# Patient Record
Sex: Male | Born: 1965 | Race: White | Hispanic: No | Marital: Married | Smoking: Current every day smoker
Health system: Southern US, Community
[De-identification: ages and names within clinical notes are randomized; demographics above are authoritative.]

---

## 2005-02-26 ENCOUNTER — Encounter
Admission: RE | Admit: 2005-02-26 | Discharge: 2005-02-26 | Payer: Self-pay | Admitting: Physical Medicine and Rehabilitation

## 2005-04-21 ENCOUNTER — Emergency Department (HOSPITAL_COMMUNITY): Admission: EM | Admit: 2005-04-21 | Discharge: 2005-04-21 | Payer: Self-pay | Admitting: Emergency Medicine

## 2008-02-22 ENCOUNTER — Encounter: Admission: RE | Admit: 2008-02-22 | Discharge: 2008-02-22 | Payer: Self-pay | Admitting: Orthopedic Surgery

## 2008-08-16 ENCOUNTER — Ambulatory Visit (HOSPITAL_COMMUNITY): Admission: RE | Admit: 2008-08-16 | Discharge: 2008-08-16 | Payer: Self-pay | Admitting: Orthopedic Surgery

## 2009-02-20 ENCOUNTER — Emergency Department (HOSPITAL_COMMUNITY): Admission: EM | Admit: 2009-02-20 | Discharge: 2009-02-20 | Payer: Self-pay | Admitting: Emergency Medicine

## 2009-03-19 ENCOUNTER — Inpatient Hospital Stay (HOSPITAL_COMMUNITY): Admission: EM | Admit: 2009-03-19 | Discharge: 2009-03-23 | Payer: Self-pay | Admitting: Emergency Medicine

## 2009-05-23 ENCOUNTER — Encounter: Admission: RE | Admit: 2009-05-23 | Discharge: 2009-08-21 | Payer: Self-pay | Admitting: Orthopedic Surgery

## 2009-08-01 ENCOUNTER — Emergency Department (HOSPITAL_BASED_OUTPATIENT_CLINIC_OR_DEPARTMENT_OTHER): Admission: EM | Admit: 2009-08-01 | Discharge: 2009-08-01 | Payer: Self-pay | Admitting: Emergency Medicine

## 2009-08-02 ENCOUNTER — Emergency Department (HOSPITAL_COMMUNITY): Admission: EM | Admit: 2009-08-02 | Discharge: 2009-08-02 | Payer: Self-pay | Admitting: Emergency Medicine

## 2009-08-27 ENCOUNTER — Emergency Department (HOSPITAL_COMMUNITY): Admission: EM | Admit: 2009-08-27 | Discharge: 2009-08-27 | Payer: Self-pay | Admitting: Emergency Medicine

## 2009-09-08 ENCOUNTER — Encounter: Admission: RE | Admit: 2009-09-08 | Discharge: 2009-09-08 | Payer: Self-pay | Admitting: Orthopedic Surgery

## 2009-09-27 ENCOUNTER — Encounter: Admission: RE | Admit: 2009-09-27 | Discharge: 2009-09-27 | Payer: Self-pay | Admitting: Orthopedic Surgery

## 2009-10-15 ENCOUNTER — Emergency Department (HOSPITAL_COMMUNITY): Admission: EM | Admit: 2009-10-15 | Discharge: 2009-10-15 | Payer: Self-pay | Admitting: Emergency Medicine

## 2009-10-20 ENCOUNTER — Ambulatory Visit (HOSPITAL_COMMUNITY): Admission: RE | Admit: 2009-10-20 | Discharge: 2009-10-20 | Payer: Self-pay | Admitting: Orthopedic Surgery

## 2009-12-20 ENCOUNTER — Inpatient Hospital Stay (HOSPITAL_COMMUNITY): Admission: RE | Admit: 2009-12-20 | Discharge: 2009-12-21 | Payer: Self-pay | Admitting: Orthopedic Surgery

## 2010-04-02 ENCOUNTER — Emergency Department (HOSPITAL_COMMUNITY): Admission: EM | Admit: 2010-04-02 | Discharge: 2010-04-02 | Payer: Self-pay | Admitting: Emergency Medicine

## 2010-08-03 LAB — HEPATIC FUNCTION PANEL
Albumin: 3.5 g/dL (ref 3.5–5.2)
Alkaline Phosphatase: 75 U/L (ref 39–117)
Total Protein: 6.5 g/dL (ref 6.0–8.3)

## 2010-08-03 LAB — BASIC METABOLIC PANEL
Calcium: 9.1 mg/dL (ref 8.4–10.5)
Creatinine, Ser: 0.81 mg/dL (ref 0.4–1.5)
GFR calc Af Amer: >60 mL/min (ref >60)
GFR calc non Af Amer: >60 mL/min (ref >60)
Glucose, Bld: 106 mg/dL — ABNORMAL HIGH (ref 70–99)
Sodium: 141 mEq/L (ref 135–145)

## 2010-08-03 LAB — CBC
Hemoglobin: 14.6 g/dL (ref 13.0–17.0)
MCH: 30.5 pg (ref 26.0–34.0)
MCHC: 33.3 g/dL (ref 30.0–36.0)
Platelets: 160 10*3/uL (ref 150–400)

## 2010-08-03 LAB — SURGICAL PCR SCREEN
MRSA, PCR: NEGATIVE
Staphylococcus aureus: NEGATIVE

## 2010-08-06 LAB — URINALYSIS, ROUTINE W REFLEX MICROSCOPIC
Bilirubin Urine: NEGATIVE
Glucose, UA: NEGATIVE mg/dL
Nitrite: NEGATIVE
Specific Gravity, Urine: 1.01 (ref 1.005–1.030)
pH: 6 (ref 5.0–8.0)

## 2010-08-06 LAB — COMPREHENSIVE METABOLIC PANEL
ALT: 23 U/L (ref 0–53)
Albumin: 3.5 g/dL (ref 3.5–5.2)
Alkaline Phosphatase: 75 U/L (ref 39–117)
Calcium: 9.1 mg/dL (ref 8.4–10.5)
GFR calc Af Amer: >60 mL/min (ref >60)
Glucose, Bld: 99 mg/dL (ref 70–99)
Potassium: 4.3 mEq/L (ref 3.5–5.1)
Sodium: 134 mEq/L — ABNORMAL LOW (ref 135–145)
Total Protein: 6.4 g/dL (ref 6.0–8.3)

## 2010-08-06 LAB — CBC
Hemoglobin: 13.8 g/dL (ref 13.0–17.0)
MCHC: 33.8 g/dL (ref 30.0–36.0)
RBC: 4.6 MIL/uL (ref 4.22–5.81)
WBC: 6.7 10*3/uL (ref 4.0–10.5)

## 2010-08-06 LAB — DIFFERENTIAL
Basophils Relative: 0 % (ref 0–1)
Eosinophils Absolute: 0 10*3/uL (ref 0.0–0.7)
Lymphs Abs: 1.6 10*3/uL (ref 0.7–4.0)
Monocytes Absolute: 0.5 10*3/uL (ref 0.1–1.0)
Monocytes Relative: 8 % (ref 3–12)
Neutrophils Relative %: 68 % (ref 43–77)

## 2010-08-06 LAB — CK TOTAL AND CKMB (NOT AT ARMC)
CK, MB: 1.2 ng/mL (ref 0.3–4.0)
Relative Index: INVALID (ref 0.0–2.5)
Total CK: 46 U/L (ref 7–232)

## 2010-08-06 LAB — TROPONIN I: Troponin I: 0.02 ng/mL (ref 0.00–0.06)

## 2010-08-13 LAB — URINALYSIS, ROUTINE W REFLEX MICROSCOPIC
Nitrite: NEGATIVE
Specific Gravity, Urine: 1.011 (ref 1.005–1.030)
Urobilinogen, UA: 0.2 mg/dL (ref 0.0–1.0)
pH: 7 (ref 5.0–8.0)

## 2010-08-22 LAB — BASIC METABOLIC PANEL
Calcium: 9.2 mg/dL (ref 8.4–10.5)
GFR calc Af Amer: >60 mL/min (ref >60)
GFR calc non Af Amer: >60 mL/min (ref >60)
Potassium: 3.8 mEq/L (ref 3.5–5.1)
Sodium: 139 mEq/L (ref 135–145)

## 2010-08-22 LAB — CBC
Hemoglobin: 14.4 g/dL (ref 13.0–17.0)
RBC: 4.63 MIL/uL (ref 4.22–5.81)
WBC: 5.3 10*3/uL (ref 4.0–10.5)

## 2010-08-23 LAB — DIFFERENTIAL
Basophils Relative: 1 % (ref 0–1)
Eosinophils Absolute: 0 10*3/uL (ref 0.0–0.7)
Eosinophils Relative: 1 % (ref 0–5)
Monocytes Absolute: 0.4 10*3/uL (ref 0.1–1.0)
Monocytes Relative: 7 % (ref 3–12)
Neutro Abs: 3.8 10*3/uL (ref 1.7–7.7)

## 2010-08-23 LAB — URINE CULTURE
Colony Count: NO GROWTH
Culture: NO GROWTH

## 2010-08-23 LAB — BASIC METABOLIC PANEL
Calcium: 8.6 mg/dL (ref 8.4–10.5)
GFR calc Af Amer: >60 mL/min (ref >60)
GFR calc non Af Amer: >60 mL/min (ref >60)
Sodium: 138 mEq/L (ref 135–145)

## 2010-08-23 LAB — POCT I-STAT, CHEM 8
Calcium, Ion: 1.15 mmol/L (ref 1.12–1.32)
Chloride: 103 mEq/L (ref 96–112)
Glucose, Bld: 117 mg/dL — ABNORMAL HIGH (ref 70–99)
HCT: 43 % (ref 39.0–52.0)

## 2010-08-23 LAB — CBC
HCT: 40.6 % (ref 39.0–52.0)
Hemoglobin: 14 g/dL (ref 13.0–17.0)
MCHC: 34.5 g/dL (ref 30.0–36.0)
MCV: 90.2 fL (ref 78.0–100.0)
RBC: 4.5 MIL/uL (ref 4.22–5.81)

## 2010-08-23 LAB — PROTIME-INR
INR: 1.04 (ref 0.00–1.49)
Prothrombin Time: 13.5 seconds (ref 11.6–15.2)

## 2010-08-23 LAB — APTT: aPTT: 34 seconds (ref 24–37)

## 2010-08-23 LAB — URINALYSIS, ROUTINE W REFLEX MICROSCOPIC
Bilirubin Urine: NEGATIVE
Glucose, UA: NEGATIVE mg/dL
Ketones, ur: NEGATIVE mg/dL
Nitrite: NEGATIVE
pH: 7 (ref 5.0–8.0)

## 2010-11-26 ENCOUNTER — Other Ambulatory Visit: Payer: Self-pay | Admitting: Orthopedic Surgery

## 2010-11-26 DIAGNOSIS — M542 Cervicalgia: Secondary | ICD-10-CM

## 2010-11-28 ENCOUNTER — Ambulatory Visit
Admission: RE | Admit: 2010-11-28 | Discharge: 2010-11-28 | Disposition: A | Discharge status: Home / Self Care | Payer: Medicare Other | Source: Ambulatory Visit | Attending: Orthopedic Surgery | Admitting: Orthopedic Surgery

## 2010-11-28 DIAGNOSIS — M542 Cervicalgia: Secondary | ICD-10-CM

## 2010-12-25 ENCOUNTER — Other Ambulatory Visit (HOSPITAL_COMMUNITY): Payer: Self-pay | Admitting: Orthopedic Surgery

## 2010-12-25 ENCOUNTER — Encounter (HOSPITAL_COMMUNITY)
Admission: RE | Admit: 2010-12-25 | Discharge: 2010-12-25 | Disposition: A | Discharge status: Home / Self Care | Payer: Medicare Other | Source: Ambulatory Visit | Attending: Orthopedic Surgery | Admitting: Orthopedic Surgery

## 2010-12-25 ENCOUNTER — Ambulatory Visit (HOSPITAL_COMMUNITY)
Admission: RE | Admit: 2010-12-25 | Discharge: 2010-12-25 | Disposition: A | Discharge status: Home / Self Care | Payer: Medicare Other | Source: Ambulatory Visit | Attending: Orthopedic Surgery | Admitting: Orthopedic Surgery

## 2010-12-25 DIAGNOSIS — Z01812 Encounter for preprocedural laboratory examination: Secondary | ICD-10-CM | POA: Insufficient documentation

## 2010-12-25 DIAGNOSIS — S129XXA Fracture of neck, unspecified, initial encounter: Secondary | ICD-10-CM

## 2010-12-25 DIAGNOSIS — Z01818 Encounter for other preprocedural examination: Secondary | ICD-10-CM | POA: Insufficient documentation

## 2010-12-25 LAB — COMPREHENSIVE METABOLIC PANEL
ALT: 11 U/L (ref 0–53)
CO2: 28 mEq/L (ref 19–32)
Calcium: 9.5 mg/dL (ref 8.4–10.5)
Chloride: 105 mEq/L (ref 96–112)
Creatinine, Ser: 0.77 mg/dL (ref 0.50–1.35)
GFR calc Af Amer: >60 mL/min (ref >60)
GFR calc non Af Amer: >60 mL/min (ref >60)
Glucose, Bld: 92 mg/dL (ref 70–99)
Sodium: 140 mEq/L (ref 135–145)
Total Bilirubin: 0.2 mg/dL — ABNORMAL LOW (ref 0.3–1.2)

## 2010-12-25 LAB — CBC
MCHC: 34.3 g/dL (ref 30.0–36.0)
RDW: 13.9 % (ref 11.5–15.5)

## 2011-01-02 ENCOUNTER — Ambulatory Visit (HOSPITAL_COMMUNITY): Payer: Medicare Other

## 2011-01-02 ENCOUNTER — Inpatient Hospital Stay (HOSPITAL_COMMUNITY)
Admission: RE | Admit: 2011-01-02 | Discharge: 2011-01-03 | DRG: 030 | Disposition: A | Discharge status: Home / Self Care | Payer: Medicare Other | Source: Ambulatory Visit | Attending: Orthopedic Surgery | Admitting: Orthopedic Surgery

## 2011-01-02 DIAGNOSIS — Z981 Arthrodesis status: Secondary | ICD-10-CM

## 2011-01-02 DIAGNOSIS — T85695A Other mechanical complication of other nervous system device, implant or graft, initial encounter: Principal | ICD-10-CM | POA: Diagnosis present

## 2011-01-02 DIAGNOSIS — F17200 Nicotine dependence, unspecified, uncomplicated: Secondary | ICD-10-CM | POA: Diagnosis present

## 2011-01-02 DIAGNOSIS — Y831 Surgical operation with implant of artificial internal device as the cause of abnormal reaction of the patient, or of later complication, without mention of misadventure at the time of the procedure: Secondary | ICD-10-CM | POA: Diagnosis present

## 2011-01-02 DIAGNOSIS — Z472 Encounter for removal of internal fixation device: Secondary | ICD-10-CM

## 2011-01-10 NOTE — Op Note (Signed)
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  OPERATIVE PROCEDURE: 1. Removal of anterior cervical hardware C5-6, C6-7. 2. Exploration of fusion C5-6, C6-7. 3. Posterior fusion with posterior arthrodesis C5-6, C6-7. 4. Posterior instrumented fusion C5-C7.  COMPLICATIONS:  None.  BLOOD LOSS.:  Minimal.  FIRST ASSISTANT:  Norval Gable, PA  HISTORY:  Robert Brandt is a very pleasant 45 year old gentleman who about 6-8 months ago had an anterior cervical diskectomy and fusion.  The patient failed to follow up on a regular basis as instructed and then ultimately when got insurance back return to see me approximately 6-7 months status post surgery.  At that point, the intervertebral graft appeared to be solidly fused, but the anterior cervical plate had migrated off the spine and the patient was having difficulty swallowing.  As a result, CT scan was ordered which showed the migration of the plate and appeared to have solid bone bridge at the both intervertebral graft, as a result the ongoing neck pain and his dysphagia.  We elected to remove the anterior cervical plate to evaluate the fusion and then if needed supplemental posterior to decrease the overall range of motion and hopefully decrease his pain.  OPERATIVE NOTE:  The patient was brought to the operating room, placed supine on the operating table.  After successful induction of general anesthesia, endotracheal intubation, TED, SCDs, and Foley were inserted. The  patient was placed supine on the [place] flat table.  All bony prominences were well-padded and the neck of cervical spine was prepared.  Appropriate time-out was then done to confirm the patient procedure and all other pertinent important data.  Once this was done, a standard right-sided Clementeen Graham approach to cervical spine was taken.  A longitudinal incision was made along the lateral and medial border of sternocleidomastoid.  Sharp dissection was carried out down to and through the platysma, I sharply dissected the deep cervical fascia until I got the prevertebral fascia.  I then dissected sharply through this until I could palpate and see the anterior cervical plate.  I removed the overlying scar tissue, identified the esophagus and swept it and the trachea medially and protected it with a retractor.  I identified the carotid sheath protected laterally with a finger.  At this point, I exposed the anterior cervical plate from Z6-X0.  Using the removal screwdriver, I removed all 6 screws and the cervical plate.  I then removed the overhanging scar tissue and was able to palpate both intervertebral graft.  It appeared to be strongly fixed, there was no evidence of any gross motion.  However, because he was still having significant neck pain.  I did not want to put a new anterior cervical plate on, I elected to supplement posterior.  I irrigated the anterior wound copiously with normal saline, returned the trachea and esophagus to midline and made sure I had hemostasis using bipolar electrocautery. I placed FloSeal to help maintain the hemostasis.  I closed platysma there with interrupted 2-0 Vicryl suture and the skin with 3-0 Monocryl. Steri-Strips and dry dressing were applied.  At this point, the drapes were removed.  The spine frame was secured over the patient and he was rotated into the prone position.  Again, all bony prominences well- padded.  The shoulders were taped down  and the posterior cervical spine was prepped and draped in standard fashion.  A midline cervical incision was made starting at the inferior aspect of C4 proceeding to the inferior aspect of C7.  Sharp dissection was carried out in the midline down to the deep fascia.  Deep fascia was sharply incised in the midline to expose the C5, C6, and C7 spinous processes.  I then used a Cobb elevator and bipolar cautery to dissect the muscles off the lamina and spinous process of C6-7 and C5.  I then swept out laterally exposing the 5-6 and 6-7 facet complexes.  I continued dissecting out just beyond the lateral border of the facet complex.  At this point, he had the C5-6, C6- 7 facet joints exposed with the C5-C6 and C7 lateral masses exposed.  I then, did the same approach on the contralateral side.  Once the approach was done, x-ray was brought in to confirm that was at the appropriate level.  And then I identified my starting spot locating on the C5 facet.  I started at the inferior medial quadrant and drilled out using the Magerl technique with the appropriate angulation into the C5 lateral mass.  I confirmed trajectory with fluoroscopic guidance.  I palpated down the hole ball-tip feeler tapped and then repeated procedure at C6 and C7.  I then went to the contralateral side and working across the table, placed drilled for the C5, C6, and C7 lateral masses, using the same technique on the contralateral side.  At this point, with all 6 screws were drilled, I then took a high-speed bur, decorticated the facet joint at 5-6, 6-7, and decorticated the spinous process and lamina of C5, C6, andC7.  I then placed a 14-mm DePuy mountaineer lateral mass screw at C5 and C6 and a 12-mm screw at C7. Once both sides were in, I then placed bone graft, making sure it was packed into the facet complex and along the posterior border.  I then cut and contoured a rod, secured it to the lateral mass screws  and torqued the locking nuts down.  I did place a single cross-link in order to improve the torsional rigidity, this was located at C6.  Once all screws were torqued down, I irrigated copious normal saline. Then, I closed the deep fascia with interrupted #1 Vicryl suture and then 2-0 Vicryl sutures level and then vertical mattress suture for the skin.  At the end of the case, all needle and sponge counts were correct.  The wound were clean, dry and intact.  There was no active bleeding.  I did obtain hemostasis with bipolar electrocautery.  Dry dressings were applied, he was extubated, and transferred PACU without incident.     Alvy Beal, MD     DDB/MEDQ  D:  01/02/2011  T:  01/03/2011  Job:  [id number]  Electronically Signed by Venita Lick MD on 01/10/2011 07:30:02 AM

## 2011-05-13 IMAGING — CR DG ABDOMEN ACUTE W/ 1V CHEST
3 series · 3 of 3 positions shown · non-contrast
Comparison: 10/15/2009

CLINICAL DATA: Constipation.

ACUTE ABDOMEN SERIES (ABDOMEN 2 VIEW & CHEST 1 VIEW)

[view not recorded (1 of 3)]
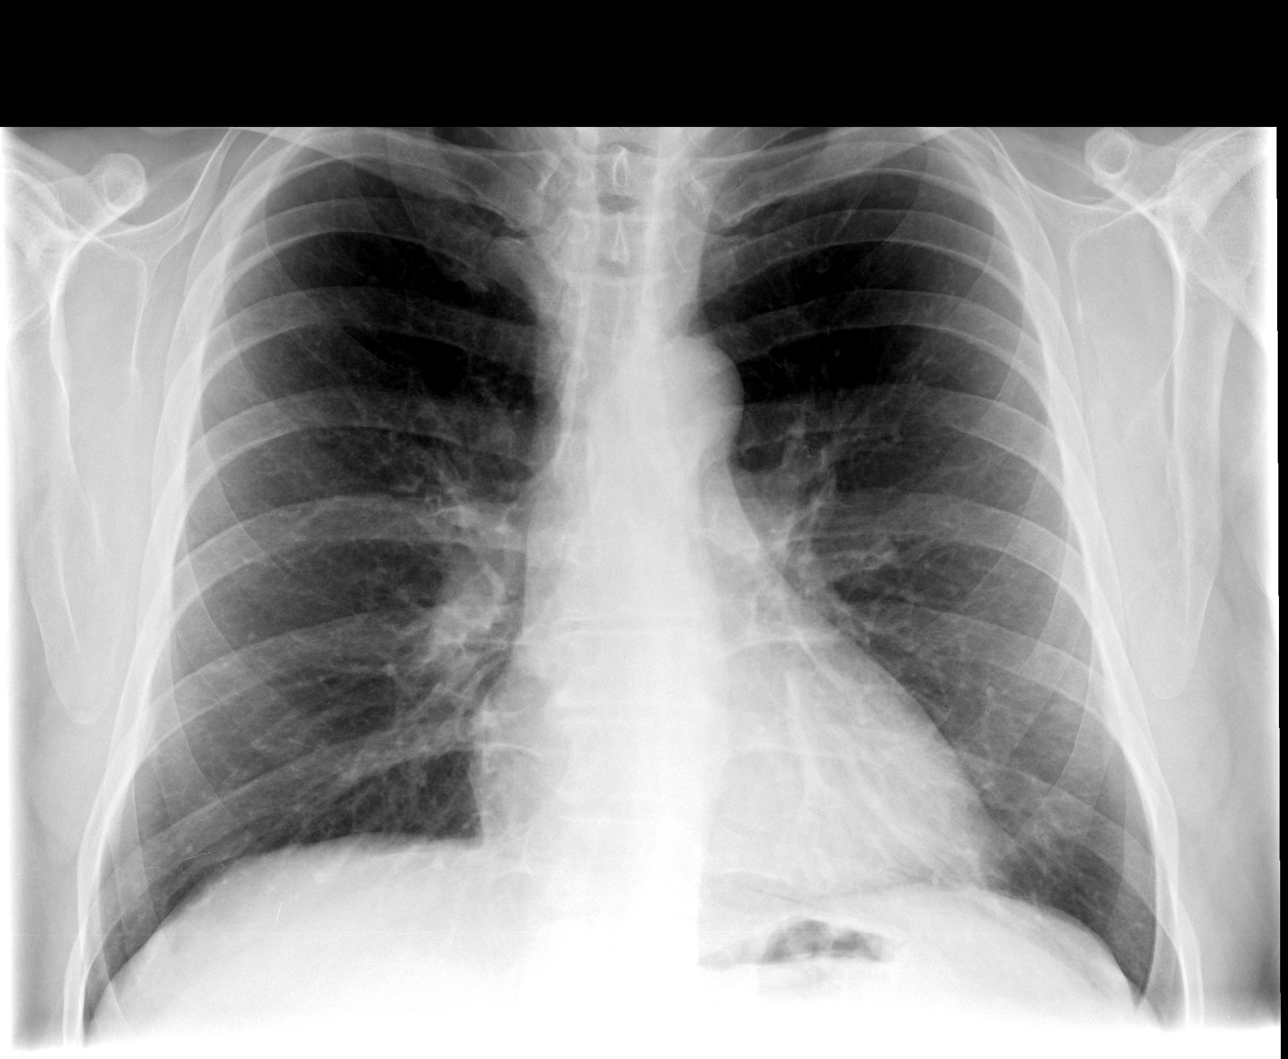

[view not recorded (2 of 3)]
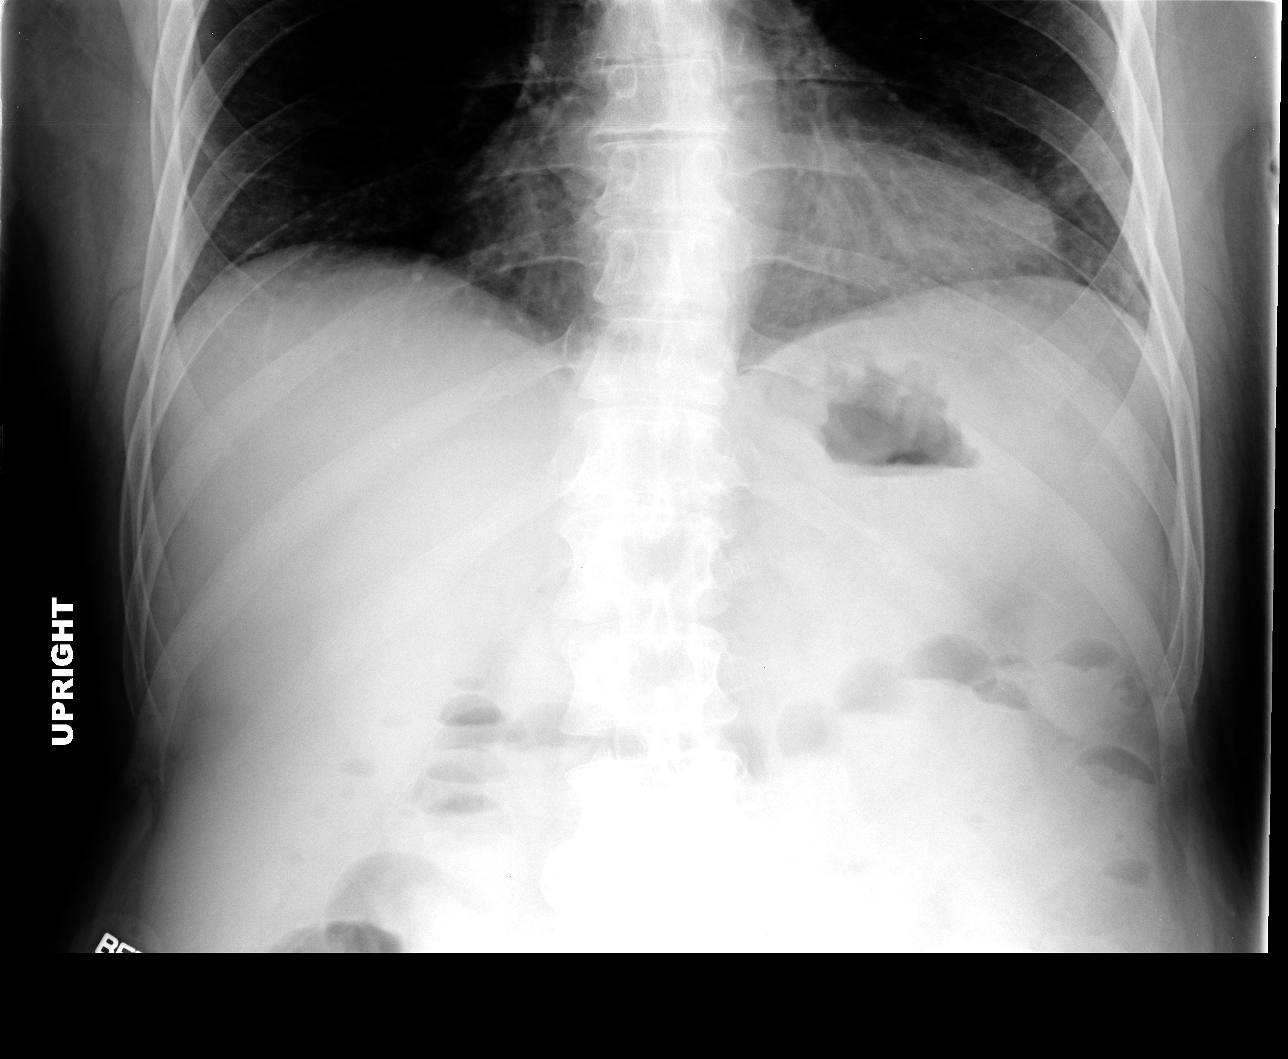

[view not recorded (3 of 3)]
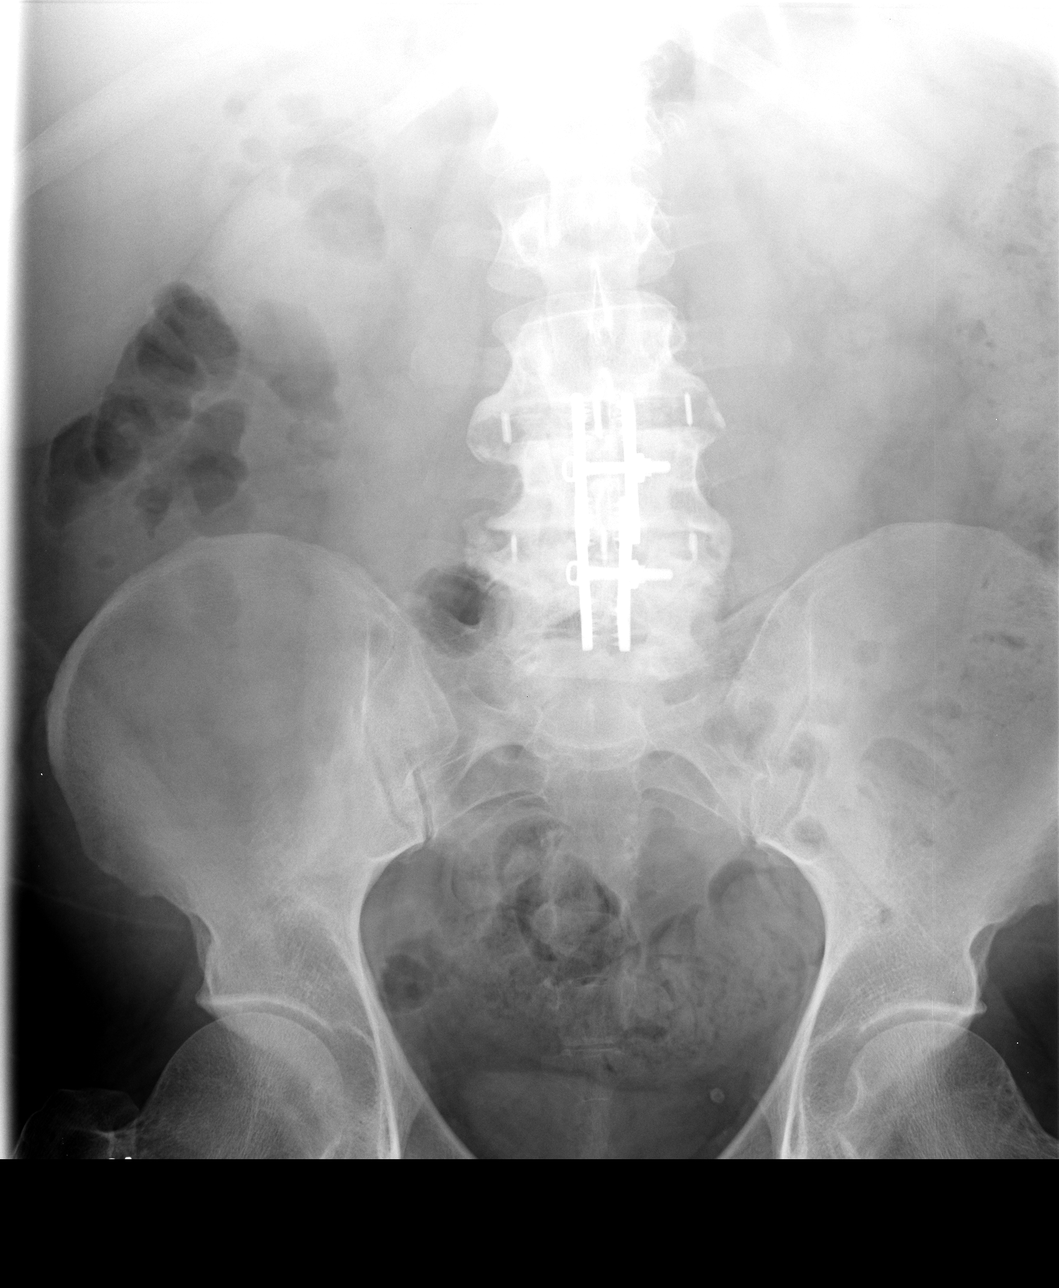

[3 of 3 positions shown; findings below may reference images not displayed]

FINDINGS: Heart and mediastinal contours are within normal limits.
No focal opacities or effusions.  No acute bony abnormality.

Moderate to large stool burden throughout the colon, not as
pronounced as on prior study.  No evidence of obstruction.  No
organomegaly or suspicious calcification.  Postoperative changes in
the lower lumbar spine.  No acute bony abnormality.
IMPRESSION: Moderate stool burden in the colon.  No obstruction or free air.

No acute cardiopulmonary disease.

## 2012-02-04 IMAGING — CR DG CERVICAL SPINE 2 OR 3 VIEWS
2 series · 2 of 2 positions shown · non-contrast
Comparison: 11/28/2010.

CLINICAL DATA: 44-year-old male preoperative study, removal of
hardware.

CERVICAL SPINE - 2-3 VIEW

[view not recorded (1 of 2)]
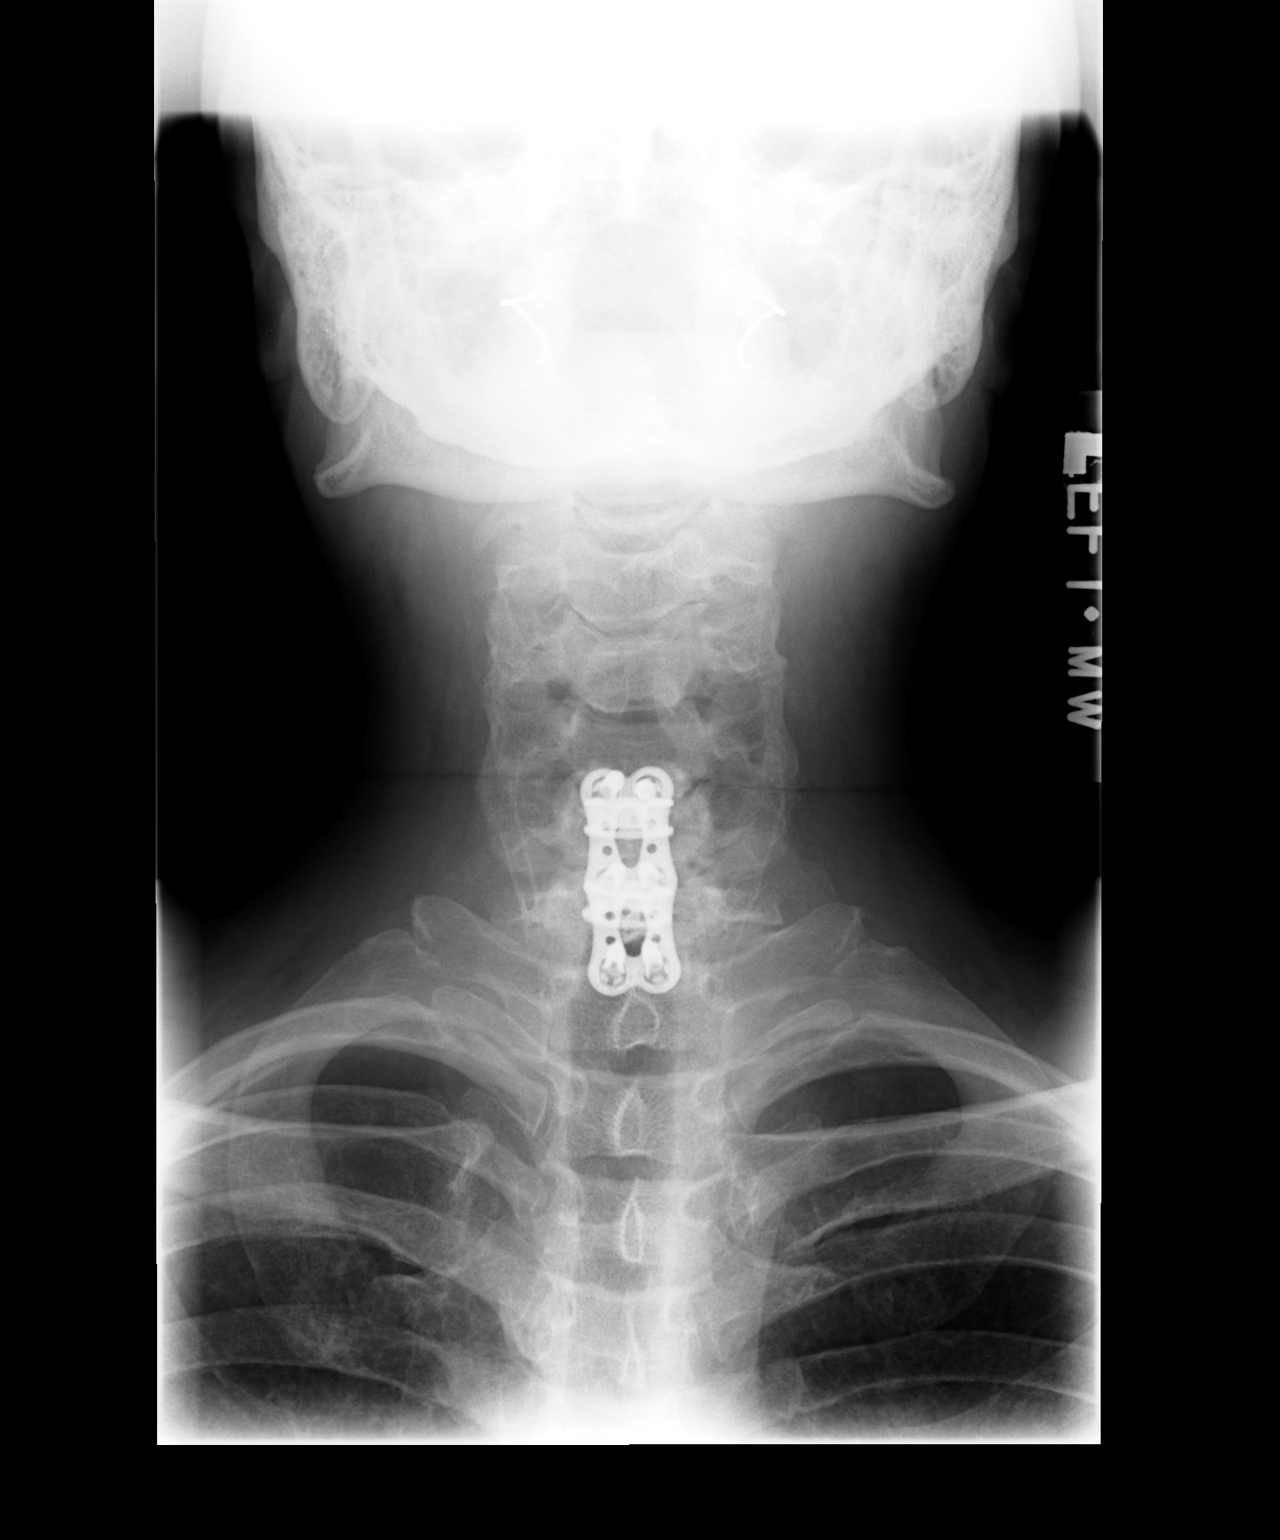

[view not recorded (2 of 2)]
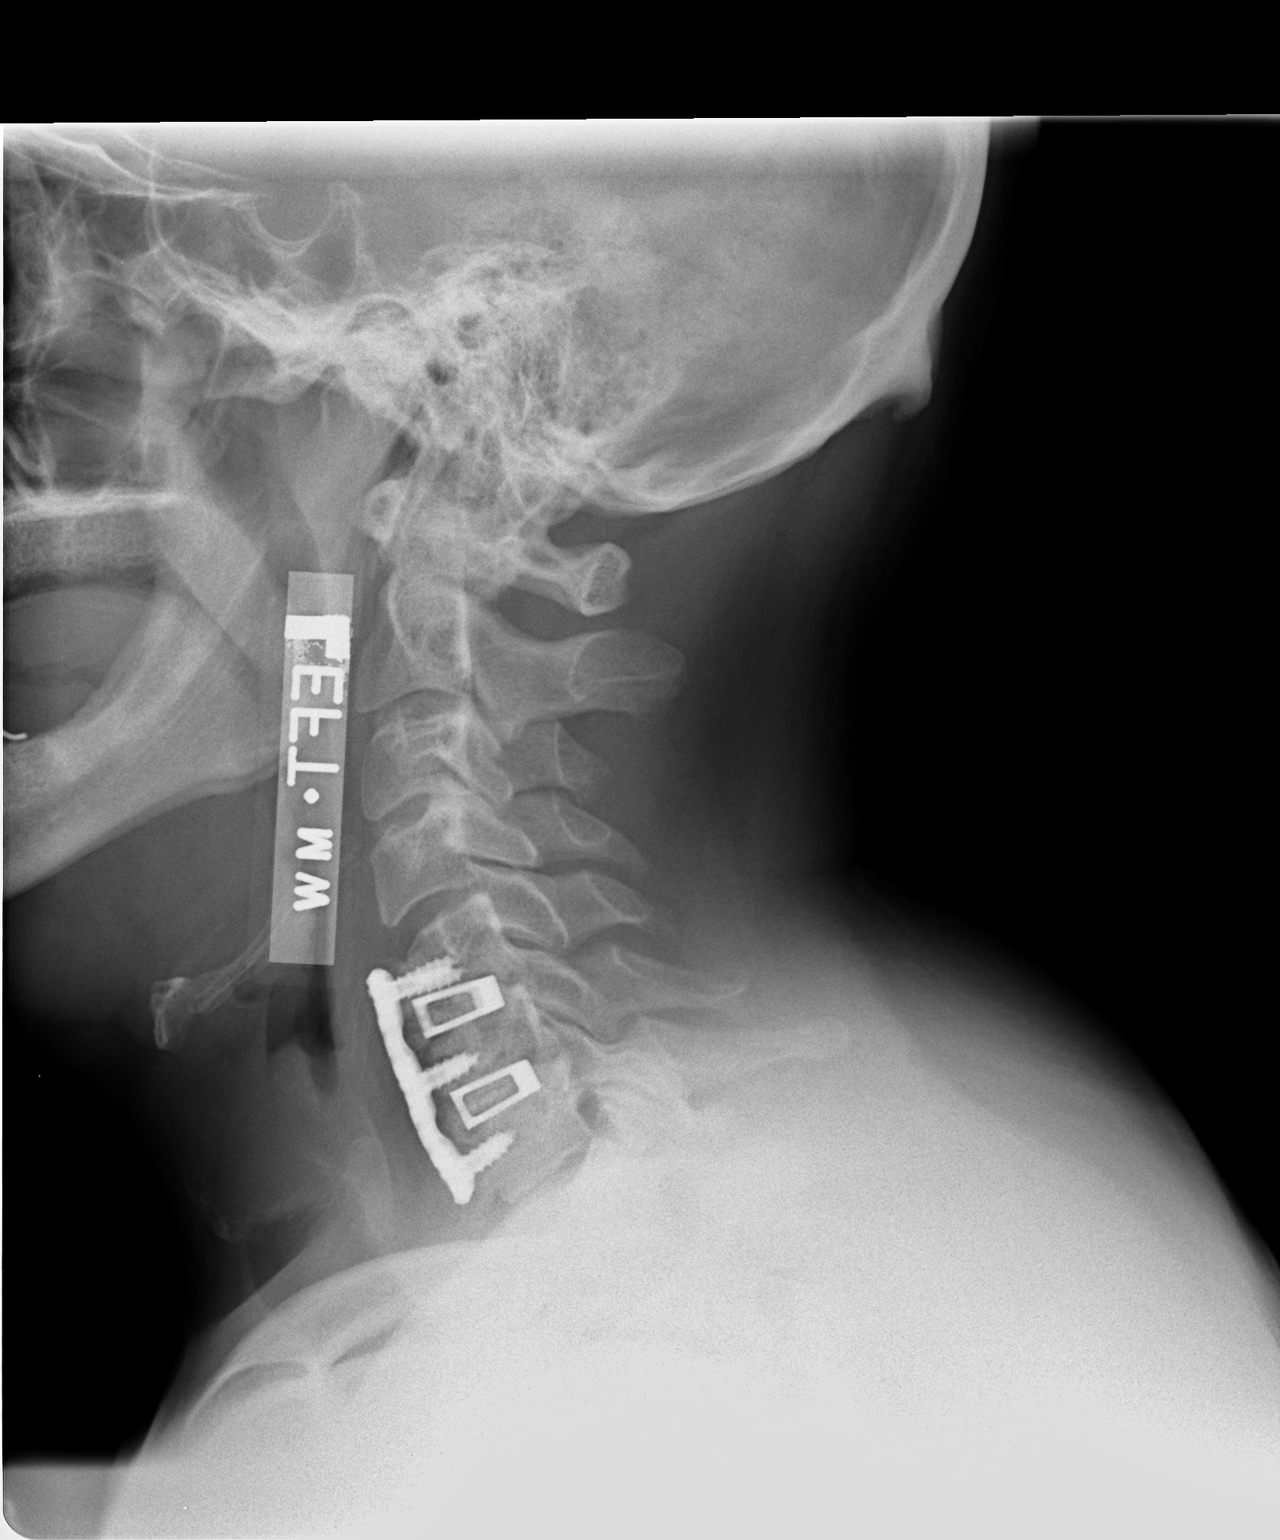

[2 of 2 positions shown; findings below may reference images not displayed]

FINDINGS: AP and lateral views of the cervical spine redemonstrates
ACDF hardware at C5-C6 and C6-C7.  Hardware appears stable.  Stable
cervical vertebral height and alignment.  Lung apices are clear.
No new findings identified.
IMPRESSION: Stable cervical spine with C5-C6 and C6-C7 ACDF hardware.

## 2017-02-05 ENCOUNTER — Other Ambulatory Visit: Payer: Self-pay | Admitting: Orthopaedic Surgery

## 2017-02-05 DIAGNOSIS — M542 Cervicalgia: Secondary | ICD-10-CM

## 2017-02-16 ENCOUNTER — Ambulatory Visit
Admission: RE | Admit: 2017-02-16 | Discharge: 2017-02-16 | Disposition: A | Discharge status: Home / Self Care | Payer: Medicare Other | Source: Ambulatory Visit | Attending: Orthopaedic Surgery | Admitting: Orthopaedic Surgery

## 2017-02-16 DIAGNOSIS — M542 Cervicalgia: Secondary | ICD-10-CM

## 2017-10-28 ENCOUNTER — Other Ambulatory Visit: Payer: Self-pay | Admitting: Orthopaedic Surgery

## 2017-10-28 DIAGNOSIS — M4326 Fusion of spine, lumbar region: Secondary | ICD-10-CM

## 2017-11-07 ENCOUNTER — Ambulatory Visit
Admission: RE | Admit: 2017-11-07 | Discharge: 2017-11-07 | Disposition: A | Discharge status: Home / Self Care | Payer: Medicare Other | Source: Ambulatory Visit | Attending: Orthopaedic Surgery | Admitting: Orthopaedic Surgery

## 2017-11-07 ENCOUNTER — Other Ambulatory Visit: Payer: Medicare Other

## 2017-11-07 DIAGNOSIS — M4326 Fusion of spine, lumbar region: Secondary | ICD-10-CM

## 2019-01-20 ENCOUNTER — Ambulatory Visit: Payer: Medicare Other

## 2019-01-20 ENCOUNTER — Encounter: Payer: Self-pay | Admitting: Orthopaedic Surgery

## 2019-01-20 ENCOUNTER — Ambulatory Visit (INDEPENDENT_AMBULATORY_CARE_PROVIDER_SITE_OTHER): Payer: Medicare Other | Admitting: Orthopaedic Surgery

## 2019-01-20 VITALS — BP 160/107 | HR 96 | Ht 72.0 in | Wt 300.0 lb

## 2019-01-20 DIAGNOSIS — M170 Bilateral primary osteoarthritis of knee: Secondary | ICD-10-CM

## 2019-01-20 DIAGNOSIS — G8929 Other chronic pain: Secondary | ICD-10-CM

## 2019-01-20 DIAGNOSIS — M25561 Pain in right knee: Secondary | ICD-10-CM

## 2019-01-20 DIAGNOSIS — M25562 Pain in left knee: Secondary | ICD-10-CM

## 2019-01-20 DIAGNOSIS — M1711 Unilateral primary osteoarthritis, right knee: Secondary | ICD-10-CM

## 2019-01-20 NOTE — Progress Notes (Signed)
Office Visit Note   Patient: Robert Brandt           Date of Birth: Jun 14, 1965           MRN: [id number] Visit Date: 01/20/2019              Requested by: No referring provider defined for this encounter. PCP: Patient, No Pcp Per   Assessment & Plan: Visit Diagnoses:  1. Chronic pain of both knees   2. Unilateral primary osteoarthritis, right knee   3. Bilateral primary osteoarthritis of knee     Plan: Right knee injection performed which he tolerated well.  He will return in a week consider possible left knee injection.  BMI is greater than 40 needs to lose at least 5 pounds which he will work on.  Follow-Up Instructions: Return in about 1 week (around 01/27/2019).   Orders:  Orders Placed This Encounter  Procedures  . Large Joint Inj: R knee  . XR KNEE 3 VIEW LEFT  . XR KNEE 3 VIEW RIGHT   No orders of the defined types were placed in this encounter.     Procedures: Large Joint Inj: R knee on 01/20/2019 10:39 AM Indications: pain and joint swelling Details: 22 G 1.5 in needle, anterolateral approach  Arthrogram: No  Medications: 40 mg methylPREDNISolone acetate 40 MG/ML; 0.5 mL lidocaine 1 %; 4 mL bupivacaine 0.25 % Outcome: tolerated well, no immediate complications Procedure, treatment alternatives, risks and benefits explained, specific risks discussed. Consent was given by the patient. Immediately prior to procedure a time out was called to verify the correct patient, procedure, equipment, support staff and site/side marked as required. Patient was prepped and draped in the usual sterile fashion.       Clinical Data: No additional findings.   Subjective: Chief Complaint  Patient presents with  . Left Knee - Pain  . Right Knee - Pain    HPI 53 year old male with bilateral knee pain who I previously treated with a knee arthroscopy most recently in 1997 for torn meniscus.  He has been on meloxicam without relief saw an orthopedist and [place].  He  states this is not helping him he has problems with squatting climbing stairs problems if he is in a low chair.  No definite locking he has swelling in both knees.  Past history of Baker's cyst right knee.  Previous medial meniscectomies.  Pain bothers him the most at the end of the day as well as with stairs.  No history of diabetes.  Review of Systems positive her previous cervical and lumbar fusions Dr.:.  Previous knee arthroscopies both right and left Dr. Lorin Mercy.  Positive for morbid obesity, positive every day smoker, positive for hyperlipidemia, hypertension.   Objective: Vital Signs: BP (!) 160/107   Pulse 96   Ht 6' (1.829 m)   Wt 300 lb (136.1 kg)   BMI 40.69 kg/m   Physical Exam Constitutional:      Appearance: He is well-developed.  HENT:     Head: Normocephalic and atraumatic.  Eyes:     Pupils: Pupils are equal, round, and reactive to light.  Neck:     Thyroid: No thyromegaly.     Trachea: No tracheal deviation.  Cardiovascular:     Rate and Rhythm: Normal rate.  Pulmonary:     Effort: Pulmonary effort is normal.     Breath sounds: No wheezing.  Abdominal:     General: Bowel sounds are normal.  Palpations: Abdomen is soft.  Skin:    General: Skin is warm and dry.     Capillary Refill: Capillary refill takes less than 2 seconds.  Neurological:     Mental Status: He is alert and oriented to person, place, and time.  Psychiatric:        Behavior: Behavior normal.        Thought Content: Thought content normal.        Judgment: Judgment normal.     Ortho Exam patient has negative logroll of the hips negative straight leg raising.  Bilateral medial joint line tenderness with palpable osteophytes.  He reaches full extension both knees flexes past 120 degrees.  ACL PCL exam is normal.  2+ knee effusion right knee.  Distal pulses are palpable.  Specialty Comments:  No specialty comments available.  Imaging: No results found.   PMFS History: Patient Active  Problem List   Diagnosis Date Noted  . Bilateral primary osteoarthritis of knee 01/20/2019   History reviewed. No pertinent past medical history.  History reviewed. No pertinent family history.  History reviewed. No pertinent surgical history. Social History   Occupational History  . Not on file  Tobacco Use  . Smoking status: Current Every Day Smoker  . Smokeless tobacco: Never Used  Substance and Sexual Activity  . Alcohol use: Not on file  . Drug use: Not on file  . Sexual activity: Not on file

## 2019-01-26 ENCOUNTER — Encounter: Payer: Self-pay | Admitting: Orthopaedic Surgery

## 2019-01-26 ENCOUNTER — Ambulatory Visit (INDEPENDENT_AMBULATORY_CARE_PROVIDER_SITE_OTHER): Payer: Medicare Other | Admitting: Orthopaedic Surgery

## 2019-01-26 VITALS — BP 125/80 | HR 92 | Ht 72.0 in | Wt 300.0 lb

## 2019-01-26 DIAGNOSIS — M170 Bilateral primary osteoarthritis of knee: Secondary | ICD-10-CM

## 2019-01-26 DIAGNOSIS — Z6841 Body Mass Index (BMI) 40.0 and over, adult: Secondary | ICD-10-CM | POA: Insufficient documentation

## 2019-01-26 MED ORDER — METHYLPREDNISOLONE ACETATE 40 MG/ML IJ SUSP
40.0000 mg | INTRAMUSCULAR | Status: AC | PRN
  Administered 2019-01-26: 40 mg via INTRA_ARTICULAR

## 2019-01-26 MED ORDER — LIDOCAINE HCL 1 % IJ SOLN
0.5000 mL | INTRAMUSCULAR | Status: AC | PRN
  Administered 2019-01-26: .5 mL

## 2019-01-26 MED ORDER — LIDOCAINE HCL 1 % IJ SOLN
0.5000 mL | INTRAMUSCULAR | Status: AC | PRN
  Administered 2019-01-20: .5 mL

## 2019-01-26 MED ORDER — BUPIVACAINE HCL 0.5 % IJ SOLN
3.0000 mL | INTRAMUSCULAR | Status: AC | PRN
  Administered 2019-01-26: 3 mL via INTRA_ARTICULAR

## 2019-01-26 MED ORDER — BUPIVACAINE HCL 0.25 % IJ SOLN
4.0000 mL | INTRAMUSCULAR | Status: AC | PRN
  Administered 2019-01-20: 11:00:00 4 mL via INTRA_ARTICULAR

## 2019-01-26 MED ORDER — METHYLPREDNISOLONE ACETATE 40 MG/ML IJ SUSP
40.0000 mg | INTRAMUSCULAR | Status: AC | PRN
  Administered 2019-01-20: 40 mg via INTRA_ARTICULAR

## 2019-01-26 NOTE — Progress Notes (Signed)
Office Visit Note   Patient: Robert Brandt           Date of Birth: 01-Oct-1965           MRN: [id number] Visit Date: 01/26/2019              Requested by: No referring provider defined for this encounter. PCP: Patient, No Pcp Per   Assessment & Plan: Visit Diagnoses:  1. Bilateral primary osteoarthritis of knee   2. Body mass index 40.0-44.9, adult (HCC)     Plan: Left knee injection performed.  He ambulated after the injection without pain and had significant relief.  We discussed his target BMI so he could be considered a candidate for total knee arthroplasty.  He has bone-on-bone changes both knees medial compartment with lateral osteophytes and varus deformity both knees.  Recheck 2 months and we will weigh him on return. The patient meets the AMA guidelines for Morbid (severe) obesity with a BMI > 40.0 and I have recommended weight loss.   Follow-Up Instructions: Return in about 2 months (around 03/28/2019).   Orders:  Orders Placed This Encounter  Procedures  . Large Joint Inj   No orders of the defined types were placed in this encounter.     Procedures: Large Joint Inj: L knee on 01/26/2019 1:24 PM Indications: joint swelling and pain Details: 22 G 1.5 in needle, anterolateral approach  Arthrogram: No  Medications: 0.5 mL lidocaine 1 %; 3 mL bupivacaine 0.5 %; 40 mg methylPREDNISolone acetate 40 MG/ML Outcome: tolerated well, no immediate complications Procedure, treatment alternatives, risks and benefits explained, specific risks discussed. Consent was given by the patient. Immediately prior to procedure a time out was called to verify the correct patient, procedure, equipment, support staff and site/side marked as required. Patient was prepped and draped in the usual sterile fashion.       Clinical Data: No additional findings.   Subjective: Chief Complaint  Patient presents with  . Left Knee - Pain    HPI 53 year old male returns he had his  right knee injected 01/20/2019 states his right knee feels great and at the best is felt in 4 years.  He is requesting left knee injection.  Patient has pain with weightbearing swelling at the end of the day aching pain in his left knee with pain medial and lateral.  Review of Systems reviewed updated unchanged from last office visit.   Objective: Vital Signs: BP 125/80   Pulse 92   Ht 6' (1.829 m)   Wt 300 lb (136.1 kg)   BMI 40.69 kg/m   Physical Exam Constitutional:      Appearance: He is well-developed.  HENT:     Head: Normocephalic and atraumatic.  Eyes:     Pupils: Pupils are equal, round, and reactive to light.  Neck:     Thyroid: No thyromegaly.     Trachea: No tracheal deviation.  Cardiovascular:     Rate and Rhythm: Normal rate.  Pulmonary:     Effort: Pulmonary effort is normal.     Breath sounds: No wheezing.  Abdominal:     General: Bowel sounds are normal.     Palpations: Abdomen is soft.  Skin:    General: Skin is warm and dry.     Capillary Refill: Capillary refill takes less than 2 seconds.  Neurological:     Mental Status: He is alert and oriented to person, place, and time.  Psychiatric:  Behavior: Behavior normal.        Thought Content: Thought content normal.        Judgment: Judgment normal.     Ortho Exam well-healed arthroscopic portals medial and lateral x2 left knee as well as right knee.  No swelling in the right knee.  Left knee shows 2+ effusion crepitus with range of motion negative logroll of the hips distal pulses are intact. Specialty Comments:  No specialty comments available.  Imaging: No results found.   PMFS History: Patient Active Problem List   Diagnosis Date Noted  . Body mass index 40.0-44.9, adult (HCC) 01/26/2019  . Bilateral primary osteoarthritis of knee 01/20/2019   History reviewed. No pertinent past medical history.  History reviewed. No pertinent family history.  History reviewed. No pertinent surgical  history. Social History   Occupational History  . Not on file  Tobacco Use  . Smoking status: Current Every Day Smoker  . Smokeless tobacco: Never Used  Substance and Sexual Activity  . Alcohol use: Not on file  . Drug use: Not on file  . Sexual activity: Not on file

## 2019-03-30 ENCOUNTER — Ambulatory Visit: Payer: Medicare Other | Admitting: Orthopaedic Surgery

## 2019-05-25 ENCOUNTER — Other Ambulatory Visit: Payer: Self-pay

## 2019-05-25 ENCOUNTER — Ambulatory Visit: Payer: Medicare Other | Admitting: Orthopaedic Surgery

## 2019-05-25 ENCOUNTER — Encounter: Payer: Self-pay | Admitting: Orthopaedic Surgery

## 2019-05-25 VITALS — Ht 72.0 in | Wt 288.0 lb

## 2019-05-25 DIAGNOSIS — M170 Bilateral primary osteoarthritis of knee: Secondary | ICD-10-CM

## 2019-05-25 DIAGNOSIS — Z6838 Body mass index (BMI) 38.0-38.9, adult: Secondary | ICD-10-CM | POA: Diagnosis not present

## 2019-05-25 NOTE — Progress Notes (Signed)
Office Visit Note   Patient: Robert Brandt           Date of Birth: 06/09/1965           MRN: [id number] Visit Date: 05/25/2019              Requested by: No referring provider defined for this encounter. PCP: Patient, No Pcp Per   Assessment & Plan: Visit Diagnoses:  1. Body mass index 38.0-38.9, adult   2. Bilateral primary osteoarthritis of knee     Plan: Patient continues to work on weight loss and strengthening.  His mobility is improved.  I will check him back again in 66months.  I congratulated him on his efforts so far and he will continue with this.  Currently he is having more symptoms in his right and left knee.  No x-ray needed on return visit.  Follow-Up Instructions: Return in about 3 months (around 08/23/2019).   Orders:  No orders of the defined types were placed in this encounter.  No orders of the defined types were placed in this encounter.     Procedures: No procedures performed   Clinical Data: No additional findings.   Subjective: Chief Complaint  Patient presents with  . Left Knee - Pain, Follow-up  . Right Knee - Pain, Follow-up    HPI 54 year old male returns for follow-up post knee injection 01/26/2019 which was his left knee.  He is having more pain in his right than left knee and has lost 24 pounds since joining [employer] now weight is at 288 this morning.  In the office with close he weighed 291.  Patient states he wants to continue losing weight before he considers total knee arthroplasty.  He also expressed he wants to avoid much narcotics postop since he has had problems with it in the past and also had a son who passed away at age 33.  Review of Systems bilateral knee osteoarthritis previous knee arthroscopies right and left.  Daily smoker.  Positive for hypertension hyperlipidemia.  Previous history of morbid obesity now BMI 39.  Otherwise -14 point systems other than as mentioned in HPI.   Objective: Vital Signs: Ht 6' (1.829  m)   Wt 288 lb (130.6 kg)   BMI 39.06 kg/m   Physical Exam Constitutional:      Appearance: He is well-developed.  HENT:     Head: Normocephalic and atraumatic.  Eyes:     Pupils: Pupils are equal, round, and reactive to light.  Neck:     Thyroid: No thyromegaly.     Trachea: No tracheal deviation.  Cardiovascular:     Rate and Rhythm: Normal rate.  Pulmonary:     Effort: Pulmonary effort is normal.     Breath sounds: No wheezing.  Abdominal:     General: Bowel sounds are normal.     Palpations: Abdomen is soft.  Skin:    General: Skin is warm and dry.     Capillary Refill: Capillary refill takes less than 2 seconds.  Neurological:     Mental Status: He is alert and oriented to person, place, and time.  Psychiatric:        Behavior: Behavior normal.        Thought Content: Thought content normal.        Judgment: Judgment normal.     Ortho Exam patient has bilateral knee crepitus slightly worse in the right than left knee.  No genu varum or valgum distal pulses are  2 logroll to the hips.  Patient has full extension both knees flexes 120 degrees right and left.  ACL PCL is normal right and left knee.  Distal pulses are palpable.  Specialty Comments:  No specialty comments available.  Imaging: No results found.   PMFS History: Patient Active Problem List   Diagnosis Date Noted  . Body mass index 38.0-38.9, adult 05/25/2019  . Bilateral primary osteoarthritis of knee 01/20/2019   No past medical history on file.  No family history on file.  No past surgical history on file. Social History   Occupational History  . Not on file  Tobacco Use  . Smoking status: Current Every Day Smoker  . Smokeless tobacco: Never Used  Substance and Sexual Activity  . Alcohol use: Not on file  . Drug use: Not on file  . Sexual activity: Not on file

## 2019-08-24 ENCOUNTER — Ambulatory Visit: Payer: Medicare Other | Admitting: Orthopaedic Surgery

## 2019-08-24 ENCOUNTER — Encounter: Payer: Self-pay | Admitting: Orthopaedic Surgery

## 2019-08-24 ENCOUNTER — Other Ambulatory Visit: Payer: Self-pay

## 2019-08-24 VITALS — BP 141/87 | HR 99 | Ht 72.0 in | Wt 285.0 lb

## 2019-08-24 DIAGNOSIS — M1711 Unilateral primary osteoarthritis, right knee: Secondary | ICD-10-CM | POA: Diagnosis not present

## 2019-08-24 DIAGNOSIS — M1712 Unilateral primary osteoarthritis, left knee: Secondary | ICD-10-CM

## 2019-08-24 DIAGNOSIS — M170 Bilateral primary osteoarthritis of knee: Secondary | ICD-10-CM

## 2019-08-24 MED ORDER — BUPIVACAINE HCL 0.25 % IJ SOLN
4.0000 mL | INTRAMUSCULAR | Status: AC | PRN
  Administered 2019-08-24: 14:00:00 4 mL via INTRA_ARTICULAR

## 2019-08-24 MED ORDER — METHYLPREDNISOLONE ACETATE 40 MG/ML IJ SUSP
40.0000 mg | INTRAMUSCULAR | Status: AC | PRN
  Administered 2019-08-24: 14:00:00 40 mg via INTRA_ARTICULAR

## 2019-08-24 MED ORDER — LIDOCAINE HCL 1 % IJ SOLN
0.5000 mL | INTRAMUSCULAR | Status: AC | PRN
  Administered 2019-08-24: 14:00:00 .5 mL

## 2019-08-24 MED ORDER — BUPIVACAINE HCL 0.5 % IJ SOLN
3.0000 mL | INTRAMUSCULAR | Status: AC | PRN
  Administered 2019-08-24: 3 mL via INTRA_ARTICULAR

## 2019-08-24 NOTE — Progress Notes (Signed)
Office Visit Note   Patient: Robert Brandt           Date of Birth: 11-18-1965           MRN: [id number] Visit Date: 08/24/2019              Requested by: No referring provider defined for this encounter. PCP: Patient, No Pcp Per   Assessment & Plan: Visit Diagnoses:  1. Bilateral primary osteoarthritis of knee     Plan: Right left knee intra-articular injections performed which she tolerated well.  We will recheck him in 3 months.  Follow-Up Instructions: Return in about 3 months (around 11/23/2019).   Orders:  Orders Placed This Encounter  Procedures  . Large Joint Inj  . Large Joint Inj   No orders of the defined types were placed in this encounter.     Procedures: Large Joint Inj: R knee on 08/24/2019 2:19 PM Indications: pain and joint swelling Details: 22 G 1.5 in needle, anterolateral approach  Arthrogram: No  Medications: 40 mg methylPREDNISolone acetate 40 MG/ML; 0.5 mL lidocaine 1 %; 4 mL bupivacaine 0.25 % Outcome: tolerated well, no immediate complications Procedure, treatment alternatives, risks and benefits explained, specific risks discussed. Consent was given by the patient. Immediately prior to procedure a time out was called to verify the correct patient, procedure, equipment, support staff and site/side marked as required. Patient was prepped and draped in the usual sterile fashion.   Large Joint Inj: L knee on 08/24/2019 2:19 PM Indications: joint swelling and pain Details: 22 G 1.5 in needle, anterolateral approach  Arthrogram: No  Medications: 0.5 mL lidocaine 1 %; 3 mL bupivacaine 0.5 %; 40 mg methylPREDNISolone acetate 40 MG/ML Outcome: tolerated well, no immediate complications Procedure, treatment alternatives, risks and benefits explained, specific risks discussed. Consent was given by the patient. Immediately prior to procedure a time out was called to verify the correct patient, procedure, equipment, support staff and site/side marked as  required. Patient was prepped and draped in the usual sterile fashion.       Clinical Data: No additional findings.   Subjective: Chief Complaint  Patient presents with  . Left Knee - Pain, Follow-up  . Right Knee - Pain, Follow-up    HPI 54 year old male returns with bilateral knee osteoarthritis.  He lost weight to get his BMI below 40.  Previous injections in September he states worked great for a few months and is requesting repeat injections.  Had previous anterior L5-S1 fusion by Dr. Sharolyn Douglas and had some loss of fixation of plate and screws had have posterior fusion is fused L3-S1 with cages and screws into the pelvis.  He states it was several months to get over that surgery still has some back pain.  Has had cervical fusions anteriorly also posterior fusion and states he wants to wait on total knee arthroplasty would like to repeat injections done in his knee since it worked so well in September 2020.  Review of Systems positive previous knee arthroscopies right and left hypertension, high cholesterol.  Lumbar fusion cervical fusions are described above negative for chest pain heart attack.   Objective: Vital Signs: BP (!) 141/87   Pulse 99   Ht 6' (1.829 m)   Wt 285 lb (129.3 kg)   BMI 38.65 kg/m   Physical Exam Constitutional:      Appearance: He is well-developed.  HENT:     Head: Normocephalic and atraumatic.  Eyes:     Pupils: Pupils  are equal, round, and reactive to light.  Neck:     Thyroid: No thyromegaly.     Trachea: No tracheal deviation.  Cardiovascular:     Rate and Rhythm: Normal rate.  Pulmonary:     Effort: Pulmonary effort is normal.     Breath sounds: No wheezing.  Abdominal:     General: Bowel sounds are normal.     Palpations: Abdomen is soft.  Skin:    General: Skin is warm and dry.     Capillary Refill: Capillary refill takes less than 2 seconds.  Neurological:     Mental Status: He is alert and oriented to person, place, and time.    Psychiatric:        Behavior: Behavior normal.        Thought Content: Thought content normal.        Judgment: Judgment normal.     Ortho Exam patient has crepitus knee range of motion healed arthroscopic portals negative logroll to the hips negative straight leg raising well-healed lumbar incision posteriorly.  Anterior cervical incision no brachial plexus tenderness.  Some decreased sensation left lower extremity from previous lumbar procedures.  Bilateral right knee crepitus with mild varus deformity right and left.  Specialty Comments:  No specialty comments available.  Imaging: No results found.   PMFS History: Patient Active Problem List   Diagnosis Date Noted  . Body mass index 38.0-38.9, adult 05/25/2019  . Bilateral primary osteoarthritis of knee 01/20/2019   No past medical history on file.  No family history on file.  No past surgical history on file. Social History   Occupational History  . Not on file  Tobacco Use  . Smoking status: Current Every Day Smoker  . Smokeless tobacco: Never Used  Substance and Sexual Activity  . Alcohol use: Not on file  . Drug use: Not on file  . Sexual activity: Not on file

## 2019-11-23 ENCOUNTER — Ambulatory Visit: Payer: Medicare Other | Admitting: Orthopaedic Surgery

## 2019-12-15 ENCOUNTER — Ambulatory Visit (HOSPITAL_COMMUNITY)
Admission: RE | Admit: 2019-12-15 | Discharge: 2019-12-15 | Disposition: A | Discharge status: Home / Self Care | Payer: Medicare Other | Source: Ambulatory Visit | Attending: Orthopaedic Surgery | Admitting: Orthopaedic Surgery

## 2019-12-15 ENCOUNTER — Other Ambulatory Visit: Payer: Self-pay

## 2019-12-15 ENCOUNTER — Ambulatory Visit: Payer: Self-pay

## 2019-12-15 ENCOUNTER — Encounter: Payer: Self-pay | Admitting: Orthopaedic Surgery

## 2019-12-15 ENCOUNTER — Ambulatory Visit (INDEPENDENT_AMBULATORY_CARE_PROVIDER_SITE_OTHER): Payer: Medicare Other | Admitting: Orthopaedic Surgery

## 2019-12-15 VITALS — Ht 71.0 in | Wt 270.4 lb

## 2019-12-15 DIAGNOSIS — M7989 Other specified soft tissue disorders: Secondary | ICD-10-CM | POA: Insufficient documentation

## 2019-12-15 DIAGNOSIS — M1712 Unilateral primary osteoarthritis, left knee: Secondary | ICD-10-CM

## 2019-12-15 DIAGNOSIS — W19XXXA Unspecified fall, initial encounter: Secondary | ICD-10-CM | POA: Diagnosis not present

## 2019-12-15 DIAGNOSIS — M1711 Unilateral primary osteoarthritis, right knee: Secondary | ICD-10-CM | POA: Diagnosis not present

## 2019-12-15 DIAGNOSIS — M170 Bilateral primary osteoarthritis of knee: Secondary | ICD-10-CM

## 2019-12-15 DIAGNOSIS — I83893 Varicose veins of bilateral lower extremities with other complications: Secondary | ICD-10-CM | POA: Diagnosis not present

## 2019-12-15 MED ORDER — FUROSEMIDE 40 MG PO TABS: 40.0000 mg | ORAL_TABLET | Freq: Every day | ORAL | 0 refills | Status: AC

## 2019-12-15 NOTE — Progress Notes (Signed)
Office Visit Note   Patient: Robert Brandt           Date of Birth: 03-29-1966           MRN: [id number] Visit Date: 12/15/2019              Requested by: No referring provider defined for this encounter. PCP: Patient, No Pcp Per   Assessment & Plan: Visit Diagnoses:  1. Bilateral primary osteoarthritis of knee   2. Fall, initial encounter     Plan: We will set patient up for venous Doppler rule out DVT.  Knee-high teds given.  Lasix 40 mg number 10 tablets given we discussed eating some food high in potassium.  He can use it for 4 days or 5 days and see if this gets rid of the swelling.  We will call him with the Doppler test results.  Follow-Up Instructions: No follow-ups on file.   Orders:  Orders Placed This Encounter  Procedures  . XR Knee 1-2 Views Right  . XR Knee 1-2 Views Left   No orders of the defined types were placed in this encounter.     Procedures: No procedures performed   Clinical Data: No additional findings.   Subjective: Chief Complaint  Patient presents with  . Right Knee - Pain, Injury  . Left Knee - Pain, Injury    HPI 54 year old male returns with bilateral severe knee osteoarthritis.  He was at the beach on Saturday tripped going down steps landing on both knees and since that time has had 3-4+ edema from his knees down but does not seem to improve with elevation he states was tingling and burning.  Skin is shiny bilaterally with pitting edema both lower extremities.  No history of DVT.  Swelling his legs are symmetrical.  He used to be on Zestoretic which had 12.5 mg of HCTZ in it but he states after he lost 100 pounds his blood pressure came down and he was stopped on his blood pressure medication.  Review of Systems 14 point systems update unchanged for 621.  Previous knee arthroscopies.  Lumbar fusion cervical fusion.  Negative cardiac history other than hypertension now improved with weight loss.   Objective: Vital Signs: Ht 5'  11" (1.803 m)   Wt (!) 270 lb 6.4 oz (122.7 kg)   BMI 37.71 kg/m   Physical Exam Constitutional:      Appearance: He is well-developed.  HENT:     Head: Normocephalic and atraumatic.  Eyes:     Pupils: Pupils are equal, round, and reactive to light.  Neck:     Thyroid: No thyromegaly.     Trachea: No tracheal deviation.  Cardiovascular:     Rate and Rhythm: Normal rate.  Pulmonary:     Effort: Pulmonary effort is normal.     Breath sounds: No wheezing.  Abdominal:     General: Bowel sounds are normal.     Palpations: Abdomen is soft.  Skin:    General: Skin is warm and dry.     Capillary Refill: Capillary refill takes less than 2 seconds.  Neurological:     Mental Status: He is alert and oriented to person, place, and time.  Psychiatric:        Behavior: Behavior normal.        Thought Content: Thought content normal.        Judgment: Judgment normal.     Ortho Exam patient has crepitus with knee range of motion.  There is 4+ edema extends from the tibial tubercle to the toes right and left.  No cellulitis.  No venous stasis ulceration. Specialty Comments:  No specialty comments available.  Imaging: No results found.   PMFS History: Patient Active Problem List   Diagnosis Date Noted  . Body mass index 38.0-38.9, adult 05/25/2019  . Bilateral primary osteoarthritis of knee 01/20/2019   History reviewed. No pertinent past medical history.  History reviewed. No pertinent family history.  History reviewed. No pertinent surgical history. Social History   Occupational History  . Not on file  Tobacco Use  . Smoking status: Current Every Day Smoker  . Smokeless tobacco: Never Used  Substance and Sexual Activity  . Alcohol use: Not on file  . Drug use: Not on file  . Sexual activity: Not on file

## 2019-12-15 NOTE — Progress Notes (Signed)
Lower extremity venous has been completed.   Preliminary results in CV Proc.   Blanch Media 12/15/2019 4:01 PM

## 2019-12-29 ENCOUNTER — Ambulatory Visit: Payer: Medicare Other | Admitting: Orthopaedic Surgery

## 2020-01-04 ENCOUNTER — Ambulatory Visit: Payer: Medicare Other | Admitting: Orthopaedic Surgery

## 2020-03-31 ENCOUNTER — Ambulatory Visit: Payer: Medicare Other | Admitting: Orthopaedic Surgery

## 2020-06-21 ENCOUNTER — Ambulatory Visit: Payer: Medicare Other | Admitting: Orthopaedic Surgery
